# Patient Record
Sex: Male | Born: 1975 | Race: White | Hispanic: No | Marital: Married | Smoking: Never smoker
Health system: Southern US, Community
[De-identification: ages and names within clinical notes are randomized; demographics above are authoritative.]

## PROBLEM LIST (undated history)

## (undated) HISTORY — PX: BACK SURGERY: SHX140

---

## 2005-09-07 ENCOUNTER — Ambulatory Visit: Payer: Self-pay | Admitting: Orthopedic Surgery

## 2005-09-09 ENCOUNTER — Ambulatory Visit (HOSPITAL_COMMUNITY): Admission: RE | Admit: 2005-09-09 | Discharge: 2005-09-09 | Payer: Self-pay | Admitting: Orthopedic Surgery

## 2005-09-09 ENCOUNTER — Ambulatory Visit: Payer: Self-pay | Admitting: Orthopedic Surgery

## 2005-09-21 ENCOUNTER — Ambulatory Visit: Payer: Self-pay | Admitting: Orthopedic Surgery

## 2005-10-05 ENCOUNTER — Ambulatory Visit: Payer: Self-pay | Admitting: Orthopedic Surgery

## 2008-04-03 ENCOUNTER — Ambulatory Visit: Payer: Self-pay | Admitting: Orthopedic Surgery

## 2008-04-03 DIAGNOSIS — M654 Radial styloid tenosynovitis [de Quervain]: Secondary | ICD-10-CM | POA: Insufficient documentation

## 2008-04-03 DIAGNOSIS — M7150 Other bursitis, not elsewhere classified, unspecified site: Secondary | ICD-10-CM | POA: Insufficient documentation

## 2008-04-03 DIAGNOSIS — M25549 Pain in joints of unspecified hand: Secondary | ICD-10-CM | POA: Insufficient documentation

## 2010-10-29 NOTE — Op Note (Signed)
NAME:  Brian Mathews, Brian Mathews NO.:  [id number]   MEDICAL RECORD NO.:  [id number]          PATIENT TYPE:  AMB   LOCATION:  DAY                           FACILITY:  [facility]   PHYSICIAN:  Vickki Hearing, M.D.DATE OF BIRTH:  November 14, 1975   DATE OF PROCEDURE:  09/09/2005  DATE OF DISCHARGE:                                 OPERATIVE REPORT   HISTORY:  This is a 35 year old male who injured his ACL back in high  school. Had no problems with his knee after treatment. Did not have  reconstruction. Had therapy and bracing and has done well up until  approximately two and a half  to three weeks ago.  He was squatting at work  several times.  Once he got up, he started having pain and swelling in his  knee with catching sensation over the medial aspect of the knee joint. Did  some home remedies, eventually sought medical attention with Dr. Arletha Grippe.  MRI was ordered. Diagnosis of torn medial meniscus was made with mild  osteoarthritis, and an old ACL tear was visualized.   PREOPERATIVE DIAGNOSIS:  Old ACL tear, left knee, and arthritis, left knee,  and torn medial meniscus, left knee.   POSTOPERATIVE DIAGNOSIS:  Medial plica, chondral lesion, medial femoral  condyle, old ACL tear.   PROCEDURE:  Arthroscopy, left knee, resection of medial plica, chondroplasty  of medial femoral condyle and exam under anesthesia.   OPERATIVE FINDINGS:  All menisci were normal. ACL had torn proximally and  scarred down to the lateral wall into the PCL. The lateral wall was empty  except for the inferior ACL remnant.  There was scar tissue attached to the  lateral wall from the ACL. PCL was normal.   Cartilage lesions were noted in the medial femoral condyle in two different  areas, and there was a large medial synovial plica.   Procedure was done as follows:  The patient was identified in preoperative  holding area as [name]. He marked his left knee as the operative  site;  countersigned by the surgeon. He was given antibiotics, taken to the  operating room after history and physical was updated and was given general  anesthesia.  Exam under anesthesia was performed. Only a glide was noted on  the pivot shift maneuver, and this was very subtle. His Lachman test seemed  to be completely normal and equal to his opposite knee.   After sterile prep and drape, time-out was completed.   Diagnostic arthroscopy was done through a lateral portal.  Findings are  noted as stated.   We resected the medial plica.  We did a chondroplasty on the medial femoral  condyle. We irrigated his knee.  We evaluated his medial meniscus thoroughly  with probing and visualization and found no tearing.   After washing the knee joint out, we closed the portal sites with Steri-  Strips.  We injected approximately a total of 30 cc of Marcaine with  epinephrine.   We placed a sterile bandage, CryoCuff, and he was extubated and taken to the  recovery in stable condition.  POSTOPERATIVE PLAN:  Start therapy on Monday. Follow-up with me on April 11.  He is discharged on Lorcet Plus 1 every 4 hours p.r.n. for pain #60 with 2  refills.      Vickki Hearing, M.D.  Electronically Signed     SEH/MEDQ  D:  09/09/2005  T:  09/12/2005  Job:  [id number]

## 2010-10-29 NOTE — H&P (Signed)
NAME:  Brian Mathews, Brian Mathews NO.:  [id number]   MEDICAL RECORD NO.:  [id number]            PATIENT TYPE:   LOCATION:                                 FACILITY:   PHYSICIAN:  Vickki Hearing, M.D.DATE OF BIRTH:  [birth year]   DATE OF ADMISSION:  DATE OF DISCHARGE:  LH                                HISTORY & PHYSICAL   CHIEF COMPLAINT:  Pain, left knee.   HISTORY:  This is a 35 year old male who injured his left knee from a  lateral blow to the knee playing football approximately 15 years ago. He was  treated with Dr. Mila Palmer in [place] with physical therapy and bracing. After four  to six weeks of treatment, he was able to return wrestling for that season  and since that time has had no problems with his knee other than occasional  ache and pain.   He is currently employed as a Data processing manager. 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 He is hard of hearing in his left ear. He has had surgery on his ears,  and he has had lumbar fusion.   FAMILY HISTORY:  Heart disease and cancer.   SOCIAL HISTORY:  She is married. He does not smoke or drink. He has an  associate's degree. His family physician is Dr. Neita Carp.   REVIEW OF SYSTEMS:  He has complaints of reflux, joint pain, poor hearing  and vertigo. His other seven systems were normal.   Approximately two weeks ago, the patient was squatting at work, came up from  a squatting position, felt pain and swelling in the left knee, eventually  sought treatment with Dr. Arletha Grippe who recommended arthroscopy. The  patient knew a physical therapy in [place] and asked him for advice in terms of  a second opinion, and he was sent to me for treatment after a MRI had been  done which showed partial old tear of his ACL and a medial meniscal tear. He  is here for treatment. Complaints of throbbing and inability to bend the  knee with catching, locking and limited range of motion.   PHYSICAL EXAMINATION:  VITAL SIGNS:  He is 246 in weight,  pulse 84,  respiratory rate 16.  GENERAL APPEARANCE:  This is a muscular male, well built. Normal nutrition,  grooming and hygiene.  CARDIOVASCULAR:  Observation and palpation were normal.  SKIN:  Normal.  LYMPH NODES:  Normal.  PSYCH EXAM:  He is alert. He is oriented x3. His mood and affect is  pleasant.  NEUROLOGICAL:  Sensory exam:  Normal sensation, reflexes, coordination.  MUSCULOSKELETAL:  Gait and station shows that he is limping. He favors the  left leg. His range of motion, however, passively is 0 to 130 which is equal  to his opposite knee. I could not produce a Lachman test on him. There may  have been some mild muscle guarding. He was tender along his medial joint  line, not over the condyle, and no lateral condylar tenderness. There was no  joint swelling. There was an audible pop and visible jumping of his patella  on flexion and extension, but it was intermittent and not constant. Muscle  tone and strength were normal in all four extremities. Alignment was good.   RADIOGRAPHS:  None were taken, but MRI does show that his ACL probably was a  proximal tear scarred to the PCL and is providing him some functional  stability.   There is compromise of the medial meniscus with suggestion of tear.   IMPRESSION:  Chronic anterior cruciate ligament tear, acute meniscal tear of  medial meniscus.   RECOMMENDATIONS:  Recommend arthroscopy and partial medial meniscectomy, but  we will not debris the old ACL tear because this is providing some  functional tissue. The patient has agreed to risks and benefits of the  procedure as described. We will proceed on March 30.      Vickki Hearing, M.D.  Electronically Signed     SEH/MEDQ  D:  09/07/2005  T:  09/08/2005  Job:  [id number]   cc:   [facility]  Fax: [phone]   [facility]  Fax: [phone]

## 2011-07-14 ENCOUNTER — Ambulatory Visit: Payer: Self-pay | Admitting: Orthopedic Surgery

## 2012-06-26 ENCOUNTER — Other Ambulatory Visit (HOSPITAL_COMMUNITY): Payer: Self-pay

## 2012-06-27 ENCOUNTER — Other Ambulatory Visit (HOSPITAL_COMMUNITY): Payer: Self-pay

## 2012-07-02 ENCOUNTER — Other Ambulatory Visit (HOSPITAL_COMMUNITY): Payer: Self-pay | Admitting: Neurology

## 2012-07-02 ENCOUNTER — Other Ambulatory Visit (HOSPITAL_COMMUNITY): Payer: Self-pay

## 2012-07-02 DIAGNOSIS — R55 Syncope and collapse: Secondary | ICD-10-CM

## 2012-07-03 ENCOUNTER — Telehealth: Payer: Self-pay | Admitting: *Deleted

## 2012-07-03 ENCOUNTER — Ambulatory Visit (HOSPITAL_COMMUNITY): Payer: BC Managed Care – PPO | Attending: Cardiology | Admitting: Radiology

## 2012-07-03 ENCOUNTER — Encounter (INDEPENDENT_AMBULATORY_CARE_PROVIDER_SITE_OTHER): Payer: BC Managed Care – PPO

## 2012-07-03 ENCOUNTER — Other Ambulatory Visit: Payer: Self-pay | Admitting: *Deleted

## 2012-07-03 DIAGNOSIS — R55 Syncope and collapse: Secondary | ICD-10-CM | POA: Insufficient documentation

## 2012-07-03 NOTE — Telephone Encounter (Signed)
30 day event monitor placed on Pt 07/03/12 TK

## 2012-07-03 NOTE — Progress Notes (Signed)
Echocardiogram performed.  

## 2012-07-04 ENCOUNTER — Encounter (HOSPITAL_COMMUNITY): Payer: Self-pay | Admitting: Neurology

## 2012-07-22 ENCOUNTER — Emergency Department (HOSPITAL_COMMUNITY)
Admission: EM | Admit: 2012-07-22 | Discharge: 2012-07-22 | Disposition: A | Discharge status: Home / Self Care | Payer: BC Managed Care – PPO | Attending: Emergency Medicine | Admitting: Emergency Medicine

## 2012-07-22 ENCOUNTER — Emergency Department (HOSPITAL_COMMUNITY): Payer: BC Managed Care – PPO

## 2012-07-22 ENCOUNTER — Encounter (HOSPITAL_COMMUNITY): Payer: Self-pay | Admitting: Nurse Practitioner

## 2012-07-22 DIAGNOSIS — H538 Other visual disturbances: Secondary | ICD-10-CM | POA: Insufficient documentation

## 2012-07-22 DIAGNOSIS — R55 Syncope and collapse: Secondary | ICD-10-CM | POA: Insufficient documentation

## 2012-07-22 DIAGNOSIS — H539 Unspecified visual disturbance: Secondary | ICD-10-CM

## 2012-07-22 DIAGNOSIS — Z79899 Other long term (current) drug therapy: Secondary | ICD-10-CM | POA: Insufficient documentation

## 2012-07-22 LAB — CBC WITH DIFFERENTIAL/PLATELET
Eosinophils Absolute: 0.3 10*3/uL (ref 0.0–0.7)
Hemoglobin: 16.4 g/dL (ref 13.0–17.0)
Lymphocytes Relative: 43 % (ref 12–46)
Lymphs Abs: 3.1 10*3/uL (ref 0.7–4.0)
MCH: 31.4 pg (ref 26.0–34.0)
MCV: 88.9 fL (ref 78.0–100.0)
Monocytes Relative: 9 % (ref 3–12)
Neutrophils Relative %: 44 % (ref 43–77)
RBC: 5.23 MIL/uL (ref 4.22–5.81)
WBC: 7.1 10*3/uL (ref 4.0–10.5)

## 2012-07-22 LAB — COMPREHENSIVE METABOLIC PANEL WITH GFR
ALT: 42 U/L (ref 0–53)
AST: 22 U/L (ref 0–37)
Albumin: 4.1 g/dL (ref 3.5–5.2)
Alkaline Phosphatase: 35 U/L — ABNORMAL LOW (ref 39–117)
BUN: 15 mg/dL (ref 6–23)
CO2: 27 meq/L (ref 19–32)
Calcium: 9.3 mg/dL (ref 8.4–10.5)
Chloride: 103 meq/L (ref 96–112)
Creatinine, Ser: 0.93 mg/dL (ref 0.50–1.35)
GFR calc Af Amer: >90 mL/min
GFR calc non Af Amer: >90 mL/min
Glucose, Bld: 106 mg/dL — ABNORMAL HIGH (ref 70–99)
Potassium: 4 meq/L (ref 3.5–5.1)
Sodium: 138 meq/L (ref 135–145)
Total Bilirubin: 0.3 mg/dL (ref 0.3–1.2)
Total Protein: 6.7 g/dL (ref 6.0–8.3)

## 2012-07-22 MED ORDER — DIAZEPAM 5 MG/ML IJ SOLN
INTRAMUSCULAR | Status: AC
  Filled 2012-07-22: qty 2

## 2012-07-22 MED ORDER — DIAZEPAM 5 MG/ML IJ SOLN
5.0000 mg | Freq: Once | INTRAMUSCULAR | Status: AC
  Administered 2012-07-22: 5 mg via INTRAVENOUS
  Filled 2012-07-22: qty 2

## 2012-07-22 NOTE — ED Notes (Signed)
Family at bedside, patient is still in MRI.

## 2012-07-22 NOTE — ED Notes (Signed)
Pt reports since November he has been having " episodes of tunnel vision and dizziness." had an episode while at church this am. C/o constant headache since November. Has been seen by opthomologist and PCP for same and was told he needed further testing which has not been scheduled yet

## 2012-07-22 NOTE — ED Notes (Signed)
Given to pt in MRI d/t inability to tolerate MRI.

## 2012-07-22 NOTE — ED Notes (Signed)
Family at bedside. 

## 2012-07-22 NOTE — ED Notes (Signed)
Patient back from MRI, Family at bedside.

## 2012-07-22 NOTE — ED Notes (Signed)
Dr. Delo at bedside. 

## 2012-07-22 NOTE — ED Notes (Signed)
Patient says he started having episodes where he loses concentration, starts getting dizzy, he flushes and his vision gets distorted. Patient says if he doesn't sit down he passes out.  Patient also said he started having headaches as well.  He has been having these episodes since November of last year.  He has had a CT scan done at [facility] and was scheduled to have an EEG and MRI but he was told to come to the ED if the episode got worse. The wife says he had 6 episodes on Friday, 1 on Saturday and one today so they decided to come in.  Patient said it seems to be triggered by light, so he wears his shades to try to avoid them.

## 2012-07-22 NOTE — ED Notes (Signed)
Patient transported to MRI 

## 2012-07-22 NOTE — ED Provider Notes (Signed)
History     CSN: [id number]  Arrival date & time 07/22/12  1312   First MD Initiated Contact with Patient 07/22/12 1631      Chief Complaint  Patient presents with  . Dizziness  . Eye Problem    (Consider location/radiation/quality/duration/timing/severity/associated sxs/prior treatment) HPI Comments: Patient presents with episodic blurry vision, headache, near syncope since last Thanksgiving.  This occurs every several weeks.  He states that he will be feeling well, then start with "tunnel vision", feeling flushed, and dizziness.  This will start without warning and no known precipitating events.  He has seen the neurologist, cardiologist but no cause has been found.  He is awaiting an mri and eeg, however these have not been arranged.  The wife wanted him to come here tonight as the frequency of these episodes has been increasing.    Patient is a 37 y.o. male presenting with syncope. The history is provided by the patient.  Loss of Consciousness  This is a recurrent problem. Episode onset: three months ago. The problem occurs every several days. The problem has been gradually worsening. There was no loss of consciousness. The problem is associated with an unknown factor. Associated symptoms include dizziness, light-headedness, vertigo and visual change.    History reviewed. No pertinent past medical history.  Past Surgical History  Procedure Laterality Date  . Back surgery      History reviewed. No pertinent family history.  History  Substance Use Topics  . Smoking status: Never Smoker   . Smokeless tobacco: Not on file  . Alcohol Use: No      Review of Systems  Cardiovascular: Positive for syncope.  Neurological: Positive for dizziness, vertigo and light-headedness.  All other systems reviewed and are negative.    Allergies  Review of patient's allergies indicates no known allergies.  Home Medications   Current Outpatient Rx  Name  Route  Sig  Dispense  Refill   . ibuprofen (ADVIL,MOTRIN) 200 MG tablet   Oral   Take 800 mg by mouth every 6 (six) hours as needed for pain. For headache         . ranitidine (ZANTAC) 150 MG tablet   Oral   Take 150 mg by mouth 2 (two) times daily.           BP 137/85  Pulse 64  Temp(Src) 98 F (36.7 C) (Oral)  Resp 16  SpO2 97%  Physical Exam  Nursing note and vitals reviewed. Constitutional: He is oriented to person, place, and time. He appears well-developed and well-nourished. No distress.  HENT:  Head: Normocephalic and atraumatic.  Mouth/Throat: Oropharynx is clear and moist.  Eyes: EOM are normal. Pupils are equal, round, and reactive to light.  Neck: Normal range of motion. Neck supple.  Cardiovascular: Normal rate and regular rhythm.   No murmur heard. Pulmonary/Chest: Effort normal and breath sounds normal. No respiratory distress.  Abdominal: Soft. Bowel sounds are normal. He exhibits no distension. There is no tenderness.  Musculoskeletal: Normal range of motion. He exhibits no edema.  Lymphadenopathy:    He has no cervical adenopathy.  Neurological: He is alert and oriented to person, place, and time. No cranial nerve deficit. He exhibits normal muscle tone. Coordination normal.  Skin: Skin is warm and dry. He is not diaphoretic.    ED Course  Procedures (including critical care time)  Labs Reviewed  CBC WITH DIFFERENTIAL  COMPREHENSIVE METABOLIC PANEL   No results found.   No diagnosis found.  MDM  The patient presents here with episodic dizziness and visual disturbances.  He is being worked up for this by neurology and cardiology but no cause has been found.  He comes here as the symptoms are accelerating in frequency.  The mri today is negative and the labs are normal.  I am not sure what the cause of this is, but I have recommended he go through with the EEG that is in the process of being arranged.  Seems stable for discharge.         Geoffery Lyons, MD 07/22/12  [phone]

## 2012-07-22 NOTE — ED Notes (Signed)
Patient is alert and orientedx4.  Patient was expalined discharge instructions and he understood them with no questions.  Patient's wife, Brian Mathews is taking the patient home.

## 2012-07-23 ENCOUNTER — Telehealth: Payer: Self-pay

## 2012-07-23 ENCOUNTER — Other Ambulatory Visit: Payer: Self-pay | Admitting: *Deleted

## 2012-07-23 DIAGNOSIS — R55 Syncope and collapse: Secondary | ICD-10-CM

## 2012-07-23 NOTE — Telephone Encounter (Signed)
Monitor note

## 2012-07-28 ENCOUNTER — Other Ambulatory Visit: Payer: Self-pay

## 2013-04-18 ENCOUNTER — Other Ambulatory Visit: Payer: Self-pay

## 2017-04-18 ENCOUNTER — Other Ambulatory Visit: Payer: Self-pay | Admitting: Orthopedic Surgery

## 2017-04-18 DIAGNOSIS — M5441 Lumbago with sciatica, right side: Principal | ICD-10-CM

## 2017-04-18 DIAGNOSIS — G8929 Other chronic pain: Secondary | ICD-10-CM

## 2017-04-26 ENCOUNTER — Other Ambulatory Visit (HOSPITAL_COMMUNITY): Payer: Self-pay | Admitting: Neurological Surgery

## 2017-04-26 DIAGNOSIS — M5416 Radiculopathy, lumbar region: Secondary | ICD-10-CM

## 2017-05-08 ENCOUNTER — Other Ambulatory Visit: Payer: Self-pay

## 2017-05-17 ENCOUNTER — Ambulatory Visit
Admission: RE | Admit: 2017-05-17 | Discharge: 2017-05-17 | Disposition: A | Discharge status: Home / Self Care | Payer: BLUE CROSS/BLUE SHIELD | Source: Ambulatory Visit | Attending: Orthopedic Surgery | Admitting: Orthopedic Surgery

## 2017-05-17 ENCOUNTER — Ambulatory Visit
Admission: RE | Admit: 2017-05-17 | Discharge: 2017-05-17 | Disposition: A | Discharge status: Home / Self Care | Payer: Self-pay | Source: Ambulatory Visit | Attending: Orthopedic Surgery | Admitting: Orthopedic Surgery

## 2017-05-17 DIAGNOSIS — G8929 Other chronic pain: Secondary | ICD-10-CM

## 2017-05-17 DIAGNOSIS — M5441 Lumbago with sciatica, right side: Principal | ICD-10-CM

## 2017-05-17 MED ORDER — IOPAMIDOL (ISOVUE-M 200) INJECTION 41%
15.0000 mL | Freq: Once | INTRAMUSCULAR | Status: AC
  Administered 2017-05-17: 15 mL via INTRATHECAL

## 2017-05-17 MED ORDER — DIAZEPAM 5 MG PO TABS
10.0000 mg | ORAL_TABLET | Freq: Once | ORAL | Status: AC
  Administered 2017-05-17: 10 mg via ORAL

## 2017-05-17 NOTE — Discharge Instructions (Signed)

## 2017-06-01 ENCOUNTER — Other Ambulatory Visit (HOSPITAL_COMMUNITY): Payer: Self-pay | Admitting: Orthopedic Surgery

## 2017-06-01 DIAGNOSIS — M5441 Lumbago with sciatica, right side: Secondary | ICD-10-CM

## 2017-06-07 ENCOUNTER — Encounter (HOSPITAL_COMMUNITY)
Admission: RE | Admit: 2017-06-07 | Discharge: 2017-06-07 | Disposition: A | Discharge status: Home / Self Care | Payer: BLUE CROSS/BLUE SHIELD | Source: Ambulatory Visit | Attending: Orthopedic Surgery | Admitting: Orthopedic Surgery

## 2017-06-07 DIAGNOSIS — M5441 Lumbago with sciatica, right side: Secondary | ICD-10-CM | POA: Diagnosis present

## 2017-06-07 MED ORDER — TECHNETIUM TC 99M MEDRONATE IV KIT
20.0000 | PACK | Freq: Once | INTRAVENOUS | Status: AC | PRN
  Administered 2017-06-07: 20 via INTRAVENOUS

## 2017-12-15 ENCOUNTER — Encounter: Payer: Self-pay | Admitting: Gastroenterology

## 2017-12-22 ENCOUNTER — Other Ambulatory Visit (HOSPITAL_COMMUNITY): Payer: Self-pay | Admitting: Gastroenterology

## 2017-12-22 DIAGNOSIS — R748 Abnormal levels of other serum enzymes: Secondary | ICD-10-CM

## 2017-12-22 DIAGNOSIS — R140 Abdominal distension (gaseous): Secondary | ICD-10-CM

## 2017-12-22 DIAGNOSIS — K219 Gastro-esophageal reflux disease without esophagitis: Secondary | ICD-10-CM

## 2017-12-22 DIAGNOSIS — R12 Heartburn: Secondary | ICD-10-CM

## 2017-12-25 ENCOUNTER — Ambulatory Visit (HOSPITAL_COMMUNITY)
Admission: RE | Admit: 2017-12-25 | Discharge: 2017-12-25 | Disposition: A | Discharge status: Home / Self Care | Payer: BLUE CROSS/BLUE SHIELD | Source: Ambulatory Visit | Attending: Gastroenterology | Admitting: Gastroenterology

## 2017-12-25 DIAGNOSIS — R748 Abnormal levels of other serum enzymes: Secondary | ICD-10-CM

## 2017-12-25 DIAGNOSIS — K219 Gastro-esophageal reflux disease without esophagitis: Secondary | ICD-10-CM

## 2017-12-25 DIAGNOSIS — K760 Fatty (change of) liver, not elsewhere classified: Secondary | ICD-10-CM | POA: Diagnosis not present

## 2017-12-25 DIAGNOSIS — R1110 Vomiting, unspecified: Secondary | ICD-10-CM | POA: Diagnosis not present

## 2017-12-25 DIAGNOSIS — R12 Heartburn: Secondary | ICD-10-CM | POA: Diagnosis present

## 2017-12-25 DIAGNOSIS — R140 Abdominal distension (gaseous): Secondary | ICD-10-CM | POA: Diagnosis present

## 2017-12-26 ENCOUNTER — Ambulatory Visit (HOSPITAL_COMMUNITY): Payer: BLUE CROSS/BLUE SHIELD

## 2017-12-27 ENCOUNTER — Ambulatory Visit (HOSPITAL_COMMUNITY): Payer: BLUE CROSS/BLUE SHIELD

## 2018-01-22 ENCOUNTER — Encounter

## 2018-01-22 ENCOUNTER — Ambulatory Visit: Payer: BLUE CROSS/BLUE SHIELD | Admitting: Gastroenterology

## 2018-06-02 IMAGING — NM NM BONE WHOLE BODY
2 series · 2 of 2 positions shown · non-contrast
Comparison: No recent prior.

CLINICAL DATA: Back pain.  Sciatica.

EXAM:
NUCLEAR MEDICINE WHOLE BODY BONE SCAN
TECHNIQUE: Whole body anterior and posterior images were obtained approximately
3 hours after intravenous injection of radiopharmaceutical.
RADIOPHARMACEUTICALS:  20.2 mCi Cechnetium-JJm MDP IV

[Series 1: whole body · 2.66mm/px · 1 of 1 slices shown (1 of 2)]
[im 1/1]
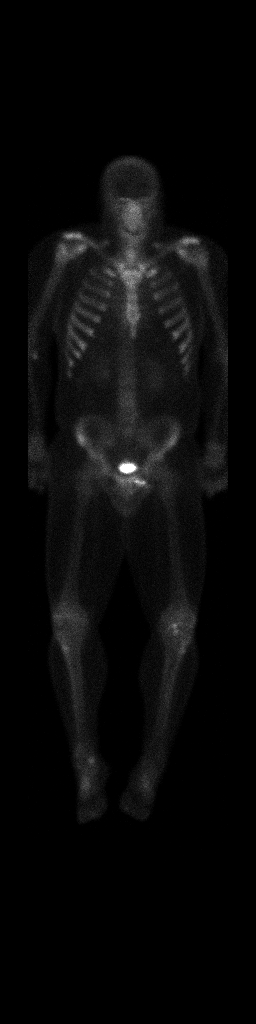

[Series 1: whole body · 2.66mm/px · 1 of 1 slices shown (2 of 2)]
[im 1/1]
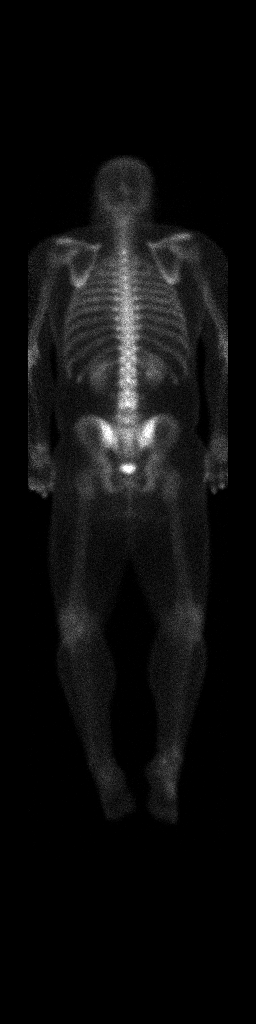

[2 of 2 positions shown; findings below may reference images not displayed]

FINDINGS: Bilateral renal function excretion. Mild increased activity noted
about both shoulders, both knees, both ankles, most likely
degenerative. No other focal abnormality identified.
IMPRESSION: Mild degenerative changes as above. No other focal abnormalities
identified. Spine appears normal.

## 2020-01-21 IMAGING — US US ABDOMEN COMPLETE
1 series · 14 of 25 positions shown · non-contrast
Comparison: None.

CLINICAL DATA: Elevated liver function tests. Epigastric pain and
bloating. Gastroesophageal reflux disease.

EXAM:
ABDOMEN ULTRASOUND COMPLETE

[Series 1: us abdomen complete · 0.25mm/px · 14 of 90 slices shown]
[im 1/90]
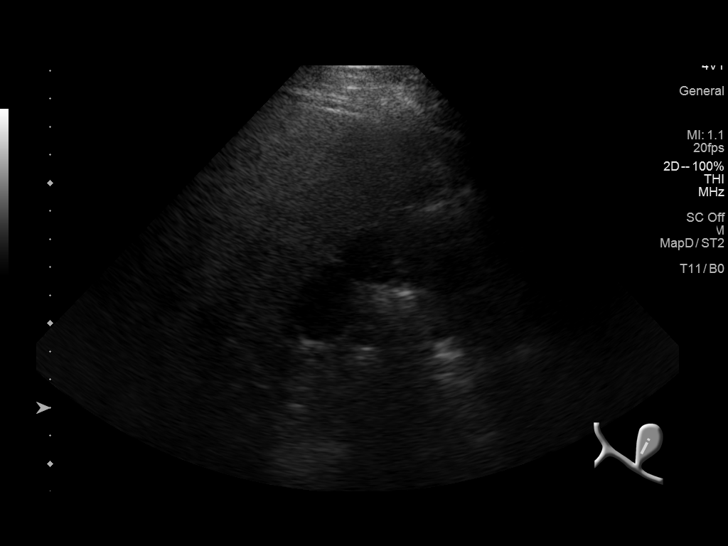
[im 8/90]
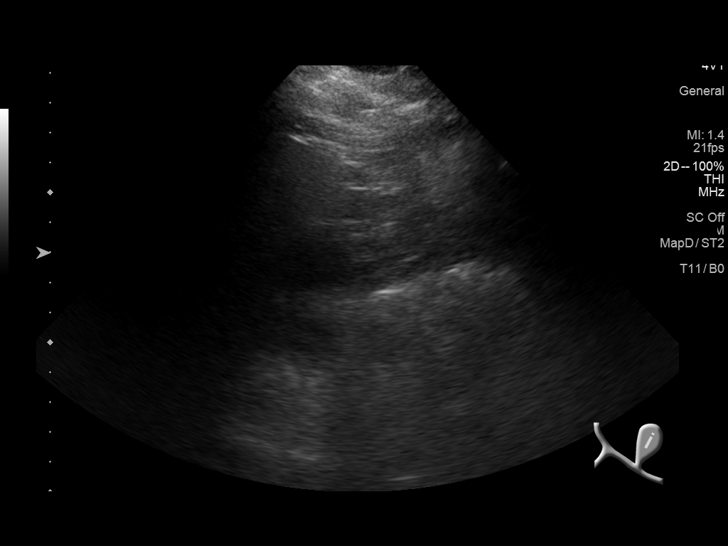
[im 15/90]
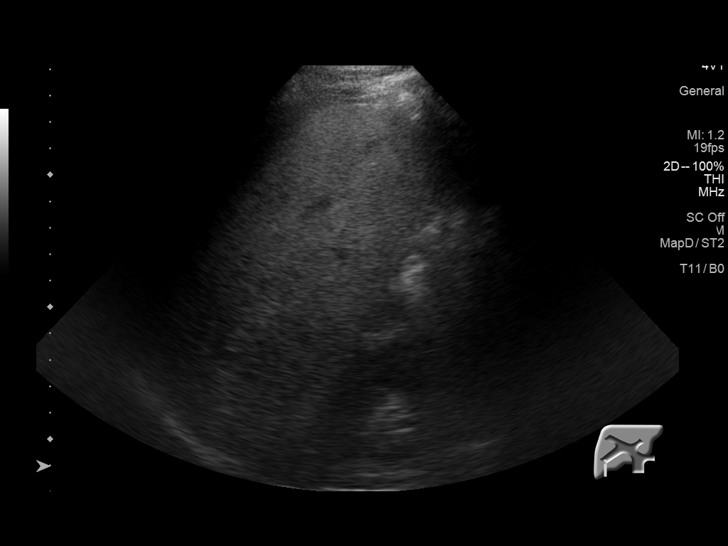
[im 23/90]
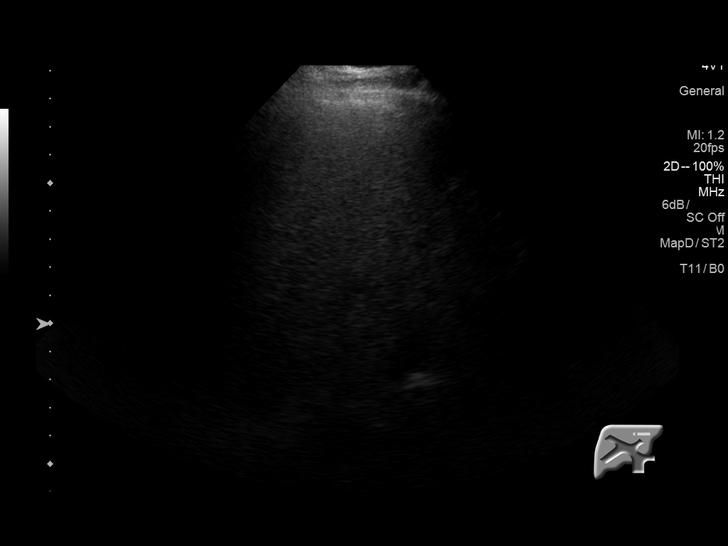
[im 30/90]
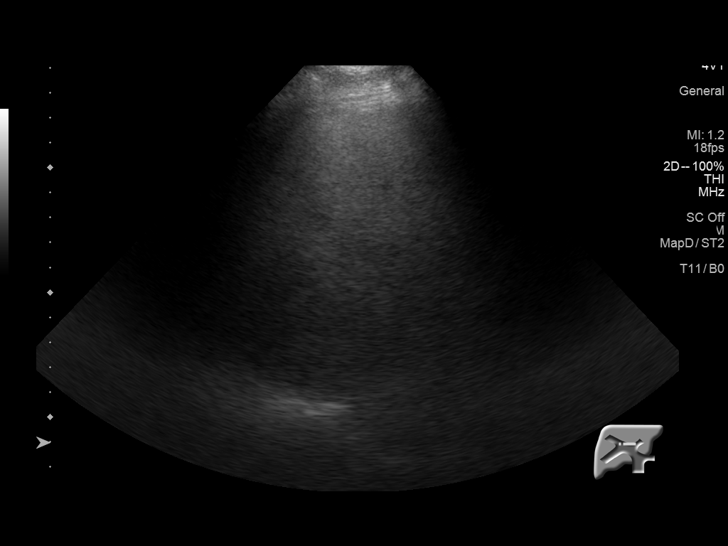
[im 34/90]
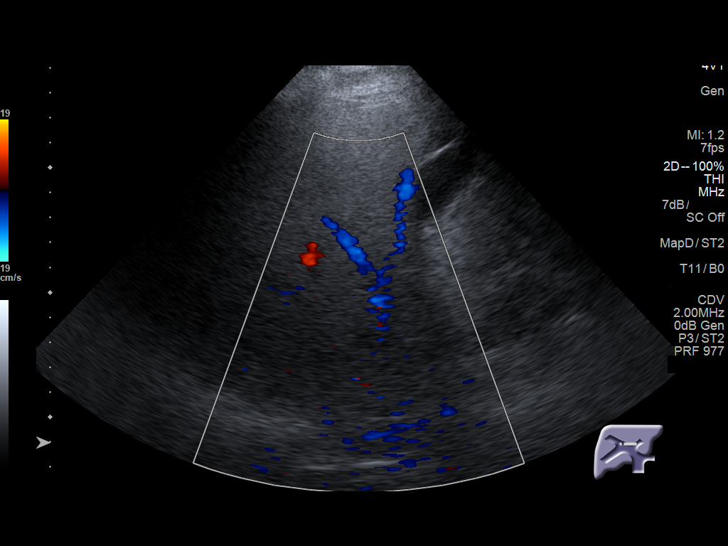
[im 41/90]
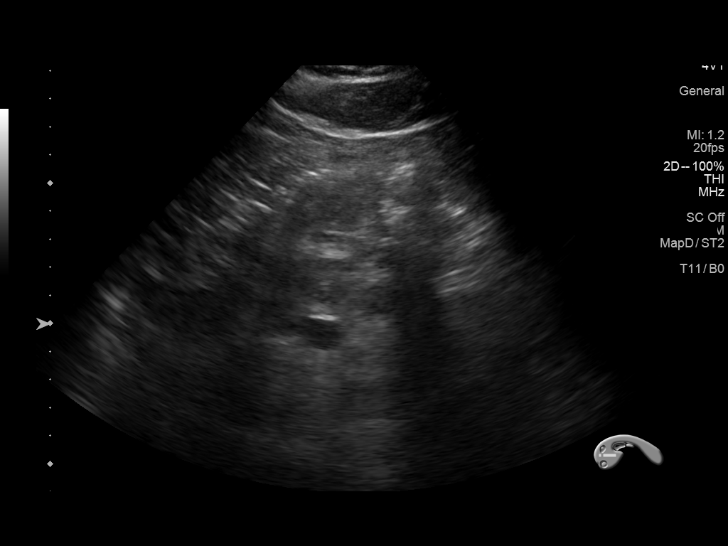
[im 49/90]
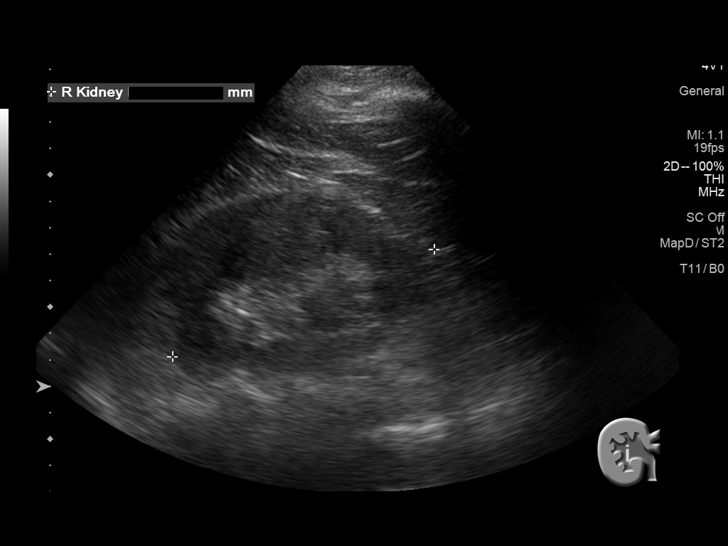
[im 56/90]
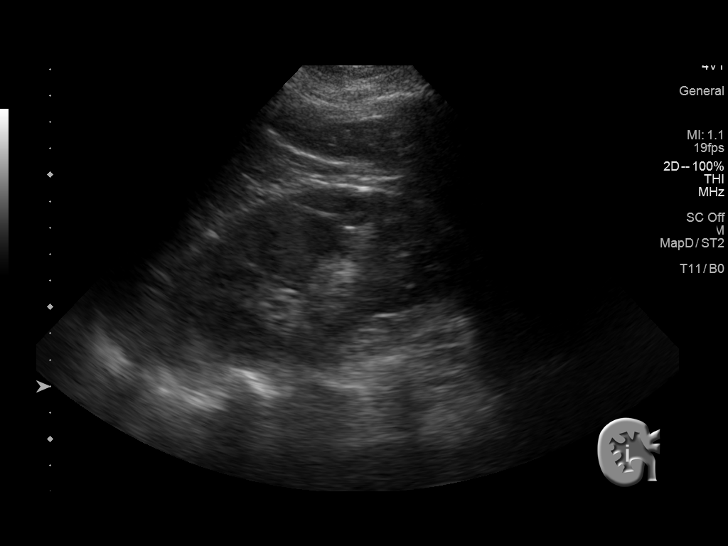
[im 60/90]
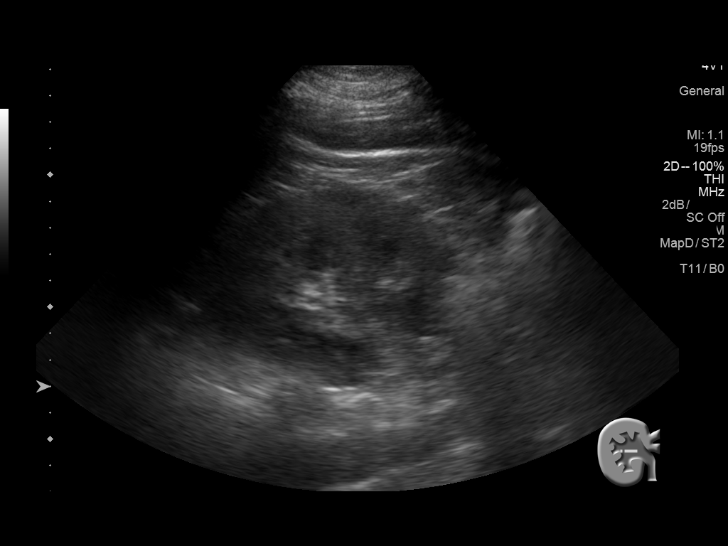
[im 67/90]
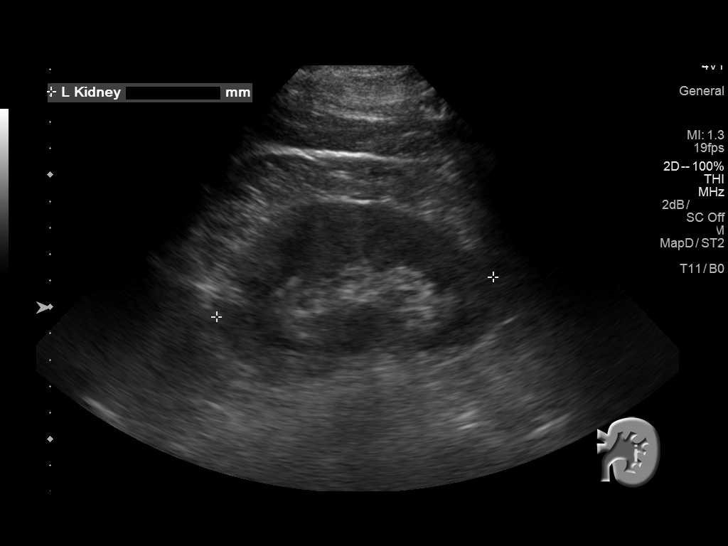
[im 75/90]
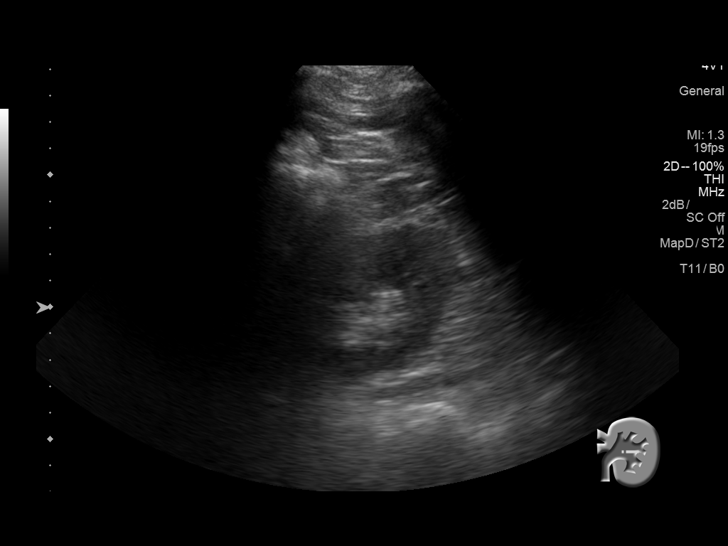
[im 82/90]
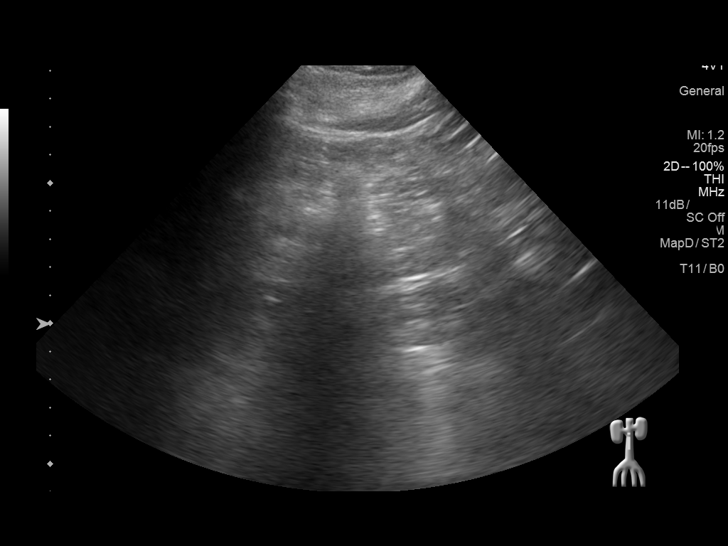
[im 90/90]
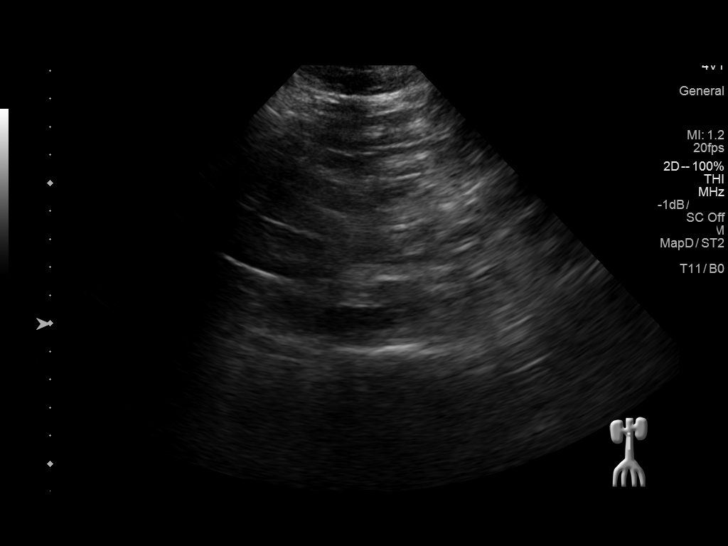

[14 of 25 positions shown; findings below may reference images not displayed]

FINDINGS: Gallbladder: No gallstones or wall thickening visualized. No
sonographic Murphy sign noted by sonographer.

Common bile duct: Diameter: 4 mm, within normal limits.

Liver: Diffusely increased echogenicity of the hepatic parenchyma,
consistent with hepatic steatosis. No focal mass lesion identified.
Portal vein is patent on color Doppler imaging with normal direction
of blood flow towards the liver.

IVC: No abnormality visualized.

Pancreas: Not visualized due to overlying bowel gas.

Spleen: Size and appearance within normal limits.

Right Kidney: Length: 10.9 cm. Echogenicity within normal limits. No
mass or hydronephrosis visualized.

Left Kidney: Length: 10.5 cm. Echogenicity within normal limits. No
mass or hydronephrosis visualized.

Abdominal aorta: No aneurysm visualized.

Other findings: None.
IMPRESSION: Diffuse hepatic steatosis.  No liver mass visualized.

No evidence of cholelithiasis, biliary dilatation, or other acute
findings.

## 2022-12-27 ENCOUNTER — Other Ambulatory Visit: Payer: Self-pay | Admitting: Neurological Surgery

## 2022-12-27 DIAGNOSIS — M5416 Radiculopathy, lumbar region: Secondary | ICD-10-CM

## 2023-01-05 ENCOUNTER — Ambulatory Visit
Admission: RE | Admit: 2023-01-05 | Discharge: 2023-01-05 | Disposition: A | Discharge status: Home / Self Care | Payer: BC Managed Care – PPO | Source: Ambulatory Visit | Attending: Neurological Surgery | Admitting: Neurological Surgery

## 2023-01-05 DIAGNOSIS — M5416 Radiculopathy, lumbar region: Secondary | ICD-10-CM
